# Patient Record
Sex: Male | Born: 1990
Health system: Southern US, Community
[De-identification: ages and names within clinical notes are randomized; demographics above are authoritative.]

---

## 2020-03-16 ENCOUNTER — Ambulatory Visit
Admission: EM | Admit: 2020-03-16 | Discharge: 2020-03-16 | Disposition: A | Discharge status: Home / Self Care | Payer: BC Managed Care – PPO | Attending: Emergency Medicine | Admitting: Emergency Medicine

## 2020-03-16 ENCOUNTER — Encounter: Payer: Self-pay | Admitting: Emergency Medicine

## 2020-03-16 ENCOUNTER — Ambulatory Visit (INDEPENDENT_AMBULATORY_CARE_PROVIDER_SITE_OTHER): Payer: BC Managed Care – PPO

## 2020-03-16 ENCOUNTER — Other Ambulatory Visit: Payer: Self-pay

## 2020-03-16 DIAGNOSIS — W208XXA Other cause of strike by thrown, projected or falling object, initial encounter: Secondary | ICD-10-CM

## 2020-03-16 DIAGNOSIS — S20222A Contusion of left back wall of thorax, initial encounter: Secondary | ICD-10-CM

## 2020-03-16 DIAGNOSIS — M545 Low back pain: Secondary | ICD-10-CM | POA: Diagnosis not present

## 2020-03-16 DIAGNOSIS — S30810A Abrasion of lower back and pelvis, initial encounter: Secondary | ICD-10-CM | POA: Diagnosis not present

## 2020-03-16 MED ORDER — CYCLOBENZAPRINE HCL 5 MG PO TABS
5.0000 mg | ORAL_TABLET | Freq: Every evening | ORAL | 0 refills | Status: DC | PRN
Start: 2020-03-16 — End: 2020-08-08

## 2020-03-16 MED ORDER — IBUPROFEN 800 MG PO TABS
800.0000 mg | ORAL_TABLET | Freq: Three times a day (TID) | ORAL | 0 refills | Status: DC
Start: 2020-03-16 — End: 2020-08-08

## 2020-03-16 NOTE — Discharge Instructions (Addendum)
Recommend RICE: rest, ice, compression, elevation as needed for pain.    Heat therapy (hot compress, warm wash rag, hot showers, etc.) can help relax muscles and soothe muscle aches. Cold therapy (ice packs) can be used to help swelling both after injury and after prolonged use of areas of chronic pain/aches.  For pain: recommend 350 mg-1000 mg of Tylenol (acetaminophen) and/or 200 mg - 800 mg of Advil (ibuprofen, Motrin) every 8 hours as needed.  May alternate between the two throughout the day as they are generally safe to take together.  DO NOT exceed more than 3000 mg of Tylenol or 3200 mg of ibuprofen in a 24 hour period as this could damage your stomach, kidneys, liver, or increase your bleeding risk.  May take muscle relaxer as needed for severe pain / spasm.  (This medication may cause you to become tired so it is important you do not drink alcohol or operate heavy machinery while on this medication.  Recommend your first dose to be taken before bedtime to monitor for side effects safely) 

## 2020-03-16 NOTE — ED Provider Notes (Signed)
[facility]    CSN: [id number] Arrival date & time: 03/16/20  1343      History   Chief Complaint Chief Complaint  Patient presents with  . Back Pain    HPI Jeffrey Preston is a 29 y.o. male presenting for left low back pain status post injury.  States this occurred Thursday: metal trailer door fell onto him.  No head trauma, LOC.  Patient denies abdominal pain, chest pain, difficulty breathing, change in urination or bowel habit.  No hematuria, hematochezia, melena.  Taken OTC meds without relief.  Does endorse chronic low back pain, though this is worse, radiates down leg.  No saddle anesthesia, weakness, urinary retention or fecal incontinence.   History reviewed. No pertinent past medical history.  There are no problems to display for this patient.   History reviewed. No pertinent surgical history.     Home Medications    Prior to Admission medications   Medication Sig Start Date End Date Taking? Authorizing Provider  cyclobenzaprine (FLEXERIL) 5 MG tablet Take 1 tablet (5 mg total) by mouth at bedtime as needed for muscle spasms. 03/16/20   Hall-Potvin, [place], PA-C  ibuprofen (ADVIL) 800 MG tablet Take 1 tablet (800 mg total) by mouth 3 (three) times daily. 03/16/20   Hall-Potvin, [place], PA-C    Family History History reviewed. No pertinent family history.  Social History Social History   Tobacco Use  . Smoking status: Current Every Day Smoker  . Smokeless tobacco: Never Used  Substance Use Topics  . Alcohol use: Yes  . Drug use: Never     Allergies   Patient has no known allergies.   Review of Systems As per HPI   Physical Exam Triage Vital Signs ED Triage Vitals [03/16/20 1358]  Enc Vitals Group     BP (!) 159/83     Pulse Rate 90     Resp 18     Temp 98.6 F (37 C)     Temp Source Oral     SpO2 97 %     Weight      Height      Head Circumference      Peak Flow      Pain Score 7     Pain Loc      Pain Edu?      Excl.  in [place]?    No data found.  Updated Vital Signs BP (!) 159/83 (BP Location: Left Arm)   Pulse 90   Temp 98.6 F (37 C) (Oral)   Resp 18   SpO2 97%   Visual Acuity Right Eye Distance:   Left Eye Distance:   Bilateral Distance:    Right Eye Near:   Left Eye Near:    Bilateral Near:     Physical Exam Constitutional:      General: He is not in acute distress. HENT:     Head: Normocephalic and atraumatic.  Eyes:     General: No scleral icterus.    Pupils: Pupils are equal, round, and reactive to light.  Cardiovascular:     Rate and Rhythm: Normal rate.  Pulmonary:     Effort: Pulmonary effort is normal. No respiratory distress.     Breath sounds: No wheezing.  Musculoskeletal:        General: Swelling and tenderness present.     Comments: Slightly decreased flexion of lumbar spine second to pain.  Patient does have minimal spinous process tenderness without swelling.  Patient does have significant  left PSIS contusion with tenderness.  No crepitus, mass.  Skin:    Coloration: Skin is not jaundiced or pale.     Findings: Bruising present.  Neurological:     Mental Status: He is alert and oriented to person, place, and time.      UC Treatments / Results  Labs (all labs ordered are listed, but only abnormal results are displayed) Labs Reviewed - No data to display  EKG   Radiology DG Lumbar Spine Complete  Result Date: 03/16/2020 CLINICAL DATA:  29 year old male with continued pain 4 days after blunt trauma with large abrasion L5-S1. EXAM: LUMBAR SPINE - COMPLETE 4+ VIEW COMPARISON:  None. FINDINGS: Normal lumbar segmentation and bone mineralization. Straightening of lumbar lordosis. No spondylolisthesis. No pars fracture. Visible sacrum and SI joints appear intact. No acute osseous abnormality identified. Relatively preserved disc spaces. Visible lower ribs appear intact. Negative abdominal visceral contours. IMPRESSION: Negative radiographic appearance of the lumbar  spine. Electronically Signed   By: Odessa Fleming M.D.   On: 03/16/2020 14:31    Procedures Procedures (including critical care time)  Medications Ordered in UC Medications - No data to display  Initial Impression / Assessment and Plan / UC Course  I have reviewed the triage vital signs and the nursing notes.  Pertinent labs & imaging results that were available during my care of the patient were reviewed by me and considered in my medical decision making (see chart for details).     Patient febrile, nontoxic in office today.  Patient does have significant contusion with mechanism of injury.  X-ray of lumbar spine done office, reviewed by me radiology: Negative for disc herniation, fracture.  Visible sacrum and SI joints intact.  Reviewed signs of patient verbalized understanding.  Will treat supportively as outlined below, provide contact information for orthopedic follow-up.  Return precautions discussed, patient verbalized understanding and is agreeable to plan. Final Clinical Impressions(s) / UC Diagnoses   Final diagnoses:  Back contusion, left, initial encounter     Discharge Instructions     Recommend RICE: rest, ice, compression, elevation as needed for pain.    Heat therapy (hot compress, warm wash rag, hot showers, etc.) can help relax muscles and soothe muscle aches. Cold therapy (ice packs) can be used to help swelling both after injury and after prolonged use of areas of chronic pain/aches.  For pain: recommend 350 mg-1000 mg of Tylenol (acetaminophen) and/or 200 mg - 800 mg of Advil (ibuprofen, Motrin) every 8 hours as needed.  May alternate between the two throughout the day as they are generally safe to take together.  DO NOT exceed more than 3000 mg of Tylenol or 3200 mg of ibuprofen in a 24 hour period as this could damage your stomach, kidneys, liver, or increase your bleeding risk.  May take muscle relaxer as needed for severe pain / spasm.  (This medication may cause  you to become tired so it is important you do not drink alcohol or operate heavy machinery while on this medication.  Recommend your first dose to be taken before bedtime to monitor for side effects safely)    ED Prescriptions    Medication Sig Dispense Auth. Provider   ibuprofen (ADVIL) 800 MG tablet Take 1 tablet (800 mg total) by mouth 3 (three) times daily. 21 tablet Hall-Potvin, [place], PA-C   cyclobenzaprine (FLEXERIL) 5 MG tablet Take 1 tablet (5 mg total) by mouth at bedtime as needed for muscle spasms. 15 tablet Hall-Potvin, [place], PA-C  I have reviewed the PDMP during this encounter.   Hall-Potvin, [place], [place] 03/16/20 1447

## 2020-03-16 NOTE — ED Triage Notes (Signed)
Pt presents to [facility] for assessment after having a metal trailer door fall onto his left lower back on Thursday of last week.  States his back pain was improving over the weekend, but he went to try to pick some stuff up today at work and the pain was too intense to continue working.  C/o radiation down left leg.  C/o increased pain with urination and bowel movements.

## 2020-07-28 ENCOUNTER — Ambulatory Visit: Payer: Self-pay

## 2020-07-28 ENCOUNTER — Ambulatory Visit
Admission: EM | Admit: 2020-07-28 | Discharge: 2020-07-28 | Disposition: A | Discharge status: Home / Self Care | Payer: BC Managed Care – PPO

## 2020-07-28 DIAGNOSIS — M5459 Other low back pain: Secondary | ICD-10-CM | POA: Diagnosis not present

## 2020-07-28 DIAGNOSIS — R1032 Left lower quadrant pain: Secondary | ICD-10-CM

## 2020-07-28 DIAGNOSIS — R102 Pelvic and perineal pain: Secondary | ICD-10-CM | POA: Diagnosis not present

## 2020-07-28 DIAGNOSIS — R109 Unspecified abdominal pain: Secondary | ICD-10-CM | POA: Diagnosis not present

## 2020-07-28 NOTE — ED Provider Notes (Signed)
[facility]    CSN: [id number] Arrival date & time: 07/28/20  0810      History   Chief Complaint Chief Complaint  Patient presents with  . Abdominal Pain    HPI Jeffrey Preston is a 29 y.o. male  Presenting for left groin pain for the last few weeks, worsening over the last 2 days.  States it radiates down to his testicle, though he has been finding it very difficult to hold still.  Denies change in urination.  States bowels are looser than normal without blood or melena.  Denies nausea, vomiting, fever.  Penile discharge or pain, lesions.  History reviewed. No pertinent past medical history.  There are no problems to display for this patient.   History reviewed. No pertinent surgical history.     Home Medications    Prior to Admission medications   Medication Sig Start Date End Date Taking? Authorizing Provider  cyclobenzaprine (FLEXERIL) 5 MG tablet Take 1 tablet (5 mg total) by mouth at bedtime as needed for muscle spasms. 03/16/20   Hall-Potvin, [place], PA-C  ibuprofen (ADVIL) 800 MG tablet Take 1 tablet (800 mg total) by mouth 3 (three) times daily. 03/16/20   Hall-Potvin, [place], PA-C    Family History History reviewed. No pertinent family history.  Social History Social History   Tobacco Use  . Smoking status: Current Every Day Smoker  . Smokeless tobacco: Never Used  Substance Use Topics  . Alcohol use: Yes  . Drug use: Never     Allergies   Patient has no known allergies.   Review of Systems Review of Systems  Constitutional: Negative for fatigue and fever.  Gastrointestinal: Positive for abdominal pain. Negative for abdominal distention, blood in stool, nausea and rectal pain.  Genitourinary: Positive for testicular pain. Negative for discharge, dysuria, frequency, genital sores, penile pain, penile swelling, scrotal swelling and urgency.  Musculoskeletal: Negative for arthralgias and myalgias.  Skin: Negative for color change and  rash.     Physical Exam Triage Vital Signs ED Triage Vitals  Enc Vitals Group     BP      Pulse      Resp      Temp      Temp src      SpO2      Weight      Height      Head Circumference      Peak Flow      Pain Score      Pain Loc      Pain Edu?      Excl. in [place]?    No data found.  Updated Vital Signs BP (!) 142/90 (BP Location: Left Arm)   Pulse 80   Temp 98.3 F (36.8 C) (Oral)   Resp 18   SpO2 97%   Visual Acuity Right Eye Distance:   Left Eye Distance:   Bilateral Distance:    Right Eye Near:   Left Eye Near:    Bilateral Near:     Physical Exam Constitutional:      General: He is not in acute distress.    Appearance: He is ill-appearing.     Comments: Patient appears very uncomfortable, has hard time finding position of comfort  HENT:     Head: Normocephalic and atraumatic.  Eyes:     General: No scleral icterus.    Pupils: Pupils are equal, round, and reactive to light.  Cardiovascular:     Rate and Rhythm: Normal rate.  Pulmonary:     Effort: Pulmonary effort is normal. No respiratory distress.     Breath sounds: No wheezing.  Abdominal:     General: Abdomen is flat. Bowel sounds are normal. There is no distension or abdominal bruit.     Palpations: Abdomen is soft.     Tenderness: There is abdominal tenderness.  Genitourinary:    Penis: Circumcised.      Testes:        Right: Mass, tenderness, swelling, testicular hydrocele or varicocele not present. Cremasteric reflex is present.         Left: Tenderness present. Swelling, testicular hydrocele or varicocele not present. Cremasteric reflex is present.      Tanner stage (genital): 5.       Comments: Pt does have perineum tenderness Skin:    Coloration: Skin is not jaundiced or pale.  Neurological:     Mental Status: He is alert and oriented to person, place, and time.      UC Treatments / Results  Labs (all labs ordered are listed, but only abnormal results are displayed) Labs  Reviewed - No data to display  EKG   Radiology No results found.  Procedures Procedures (including critical care time)  Medications Ordered in UC Medications - No data to display  Initial Impression / Assessment and Plan / UC Course  I have reviewed the triage vital signs and the nursing notes.  Pertinent labs & imaging results that were available during my care of the patient were reviewed by me and considered in my medical decision making (see chart for details).     Patient afebrile, nontoxic in office today.  Does appear to be in significant discomfort.  Difficult to decipher if pain is radiating to left testicle, or if he is having testicular pain.  Patient is diffusely tender.  Patient concern for hernia: Discussed this is likely.  Discussed possibility strangulation.  Given significant pain, recommend he go to ER for further evaluation.  Return precautions discussed, pt verbalized understanding and is agreeable to plan. Final Clinical Impressions(s) / UC Diagnoses   Final diagnoses:  Left groin pain   Discharge Instructions   None    ED Prescriptions    None     PDMP not reviewed this encounter.   Hall-Potvin, [place], [place] 07/28/20 [phone]

## 2020-07-28 NOTE — ED Triage Notes (Signed)
Pt c/o LLQ pain radiating to lt testicle x2 days. States had a trailer ramp hit him lt/mid back/side area over a month ago.

## 2020-08-08 ENCOUNTER — Encounter: Payer: Self-pay | Admitting: Family Medicine

## 2020-08-08 ENCOUNTER — Ambulatory Visit
Admission: EM | Admit: 2020-08-08 | Discharge: 2020-08-08 | Disposition: A | Discharge status: Home / Self Care | Payer: BC Managed Care – PPO | Attending: Family Medicine | Admitting: Family Medicine

## 2020-08-08 ENCOUNTER — Other Ambulatory Visit: Payer: Self-pay

## 2020-08-08 ENCOUNTER — Ambulatory Visit: Payer: Self-pay

## 2020-08-08 DIAGNOSIS — J069 Acute upper respiratory infection, unspecified: Secondary | ICD-10-CM | POA: Diagnosis not present

## 2020-08-08 NOTE — ED Provider Notes (Signed)
[facility]    CSN: [id number] Arrival date & time: 08/08/20  0802      History   Chief Complaint Chief Complaint  Patient presents with  . Cough    HPI Jeffrey Preston is a 29 y.o. male.   Established [facility] patient presents with chief complaint of cough, onset yesterday.  He was sent here by his employer who has had Covid himself.    Patient is a smoker and has three children at home.  He often gets a seasonal cough.  No shortness of breath, GI sx, loss of smell or taste, sore throat.     History reviewed. No pertinent past medical history.  There are no problems to display for this patient.   History reviewed. No pertinent surgical history.     Home Medications    Prior to Admission medications   Medication Sig Start Date End Date Taking? Authorizing Provider  cyclobenzaprine (FLEXERIL) 10 MG tablet Take by mouth.    [provider]  ibuprofen (ADVIL) 800 MG tablet Take by mouth.    [provider]    Family History Family History  Family history unknown: Yes    Social History Social History   Tobacco Use  . Smoking status: Current Every Day Smoker    Types: Cigarettes  . Smokeless tobacco: Never Used  Substance Use Topics  . Alcohol use: Yes  . Drug use: Never     Allergies   Patient has no known allergies.   Review of Systems Review of Systems  Constitutional: Negative.   HENT: Positive for congestion.   Respiratory: Positive for cough.      Physical Exam Triage Vital Signs ED Triage Vitals  Enc Vitals Group     BP      Pulse      Resp      Temp      Temp src      SpO2      Weight      Height      Head Circumference      Peak Flow      Pain Score      Pain Loc      Pain Edu?      Excl. in [place]?    No data found.  Updated Vital Signs BP (!) 141/98 (BP Location: Left Arm)   Pulse 97   Temp 97.9 F (36.6 C) (Oral)   Resp 20   SpO2 97%     Physical Exam Vitals and nursing note reviewed.   Constitutional:      General: He is not in acute distress.    Appearance: Normal appearance. He is normal weight.  HENT:     Mouth/Throat:     Mouth: Mucous membranes are moist.  Eyes:     Conjunctiva/sclera: Conjunctivae normal.  Cardiovascular:     Rate and Rhythm: Normal rate.  Pulmonary:     Effort: Pulmonary effort is normal.     Breath sounds: Normal breath sounds.  Musculoskeletal:        General: Normal range of motion.     Cervical back: Normal range of motion and neck supple.  Skin:    General: Skin is warm and dry.  Neurological:     General: No focal deficit present.     Mental Status: He is alert and oriented to person, place, and time.  Psychiatric:        Mood and Affect: Mood normal.  Behavior: Behavior normal.        Thought Content: Thought content normal.      UC Treatments / Results  Labs (all labs ordered are listed, but only abnormal results are displayed) Labs Reviewed - No data to display  EKG   Radiology No results found.  Procedures Procedures (including critical care time)  Medications Ordered in UC Medications - No data to display  Initial Impression / Assessment and Plan / UC Course  I have reviewed the triage vital signs and the nursing notes.  Pertinent labs & imaging results that were available during my care of the patient were reviewed by me and considered in my medical decision making (see chart for details).    Final Clinical Impressions(s) / UC Diagnoses   Final diagnoses:  Acute upper respiratory infection     Discharge Instructions     Strategies to prevent and/or treat COVID-19:  Vitamin D3 5000 IU (125 mcg) daily Vitamin C 500 mg twice daily Zinc 50 to 75 mg daily      ED Prescriptions    None     I have reviewed the PDMP during this encounter.   Elvina Sidle, MD 08/08/20 [phone]

## 2020-08-08 NOTE — ED Triage Notes (Signed)
Pt is here with a cough that started yesterday, pt has not taken any meds to relieve discomfort.  

## 2020-08-08 NOTE — Discharge Instructions (Addendum)
Strategies to prevent and/or treat COVID-19:  Vitamin D3 5000 IU (125 mcg) daily Vitamin C 500 mg twice daily Zinc 50 to 75 mg daily

## 2020-08-09 LAB — SARS-COV-2, NAA 2 DAY TAT

## 2020-08-09 LAB — NOVEL CORONAVIRUS, NAA: SARS-CoV-2, NAA: NOT DETECTED

## 2020-09-02 DIAGNOSIS — Z20822 Contact with and (suspected) exposure to covid-19: Secondary | ICD-10-CM | POA: Diagnosis not present

## 2020-11-10 DIAGNOSIS — Z03818 Encounter for observation for suspected exposure to other biological agents ruled out: Secondary | ICD-10-CM | POA: Diagnosis not present

## 2020-11-10 DIAGNOSIS — Z20822 Contact with and (suspected) exposure to covid-19: Secondary | ICD-10-CM | POA: Diagnosis not present

## 2020-11-13 DIAGNOSIS — Z03818 Encounter for observation for suspected exposure to other biological agents ruled out: Secondary | ICD-10-CM | POA: Diagnosis not present

## 2020-11-13 DIAGNOSIS — Z20822 Contact with and (suspected) exposure to covid-19: Secondary | ICD-10-CM | POA: Diagnosis not present

## 2020-11-27 ENCOUNTER — Ambulatory Visit: Payer: Self-pay

## 2020-11-27 DIAGNOSIS — L03039 Cellulitis of unspecified toe: Secondary | ICD-10-CM | POA: Diagnosis not present

## 2020-12-03 ENCOUNTER — Other Ambulatory Visit: Payer: Self-pay

## 2020-12-03 ENCOUNTER — Ambulatory Visit: Payer: BC Managed Care – PPO | Admitting: Podiatry

## 2020-12-03 DIAGNOSIS — L600 Ingrowing nail: Secondary | ICD-10-CM | POA: Diagnosis not present

## 2020-12-03 DIAGNOSIS — L601 Onycholysis: Secondary | ICD-10-CM

## 2020-12-03 MED ORDER — NEOMYCIN-POLYMYXIN-HC 3.5-10000-1 OT SUSP: OTIC | 0 refills | Status: AC

## 2020-12-03 MED ORDER — NEOMYCIN-POLYMYXIN-HC 3.5-10000-1 OT SUSP: OTIC | 0 refills | Status: DC

## 2020-12-03 NOTE — Patient Instructions (Signed)

## 2020-12-07 NOTE — Progress Notes (Signed)
  Subjective:  Patient ID: Jeffrey Preston, male    DOB: 09-Aug-1991,  MRN: [id number]  Chief Complaint  Patient presents with  . Nail Problem    Right hallux nail is trying to separate from the skin at the top of the toe. PT stated that it is painful     30 y.o. male presents with the above complaint. History confirmed with patient.  Thinks he damaged it many years ago  Objective:  Physical Exam: warm, good capillary refill, no trophic changes or ulcerative lesions, normal DP and PT pulses and normal sensory exam.   Right Foot: Hallux nail medial and lateral borders ingrowing, there is onycholysis of the distal half of the nail plate  Assessment:   1. Ingrowing right great toenail   2. Onycholysis      Plan:  Patient was evaluated and treated and all questions answered.     Ingrown Nail, right -Patient elects to proceed with minor surgery to remove ingrown toenail today. Consent reviewed and signed by patient. -Ingrown nail excised. See procedure note. -Educated on post-procedure care including soaking. Written instructions provided and reviewed. -Patient to follow up in 2 weeks for nail check.  Procedure: Excision of Ingrown Toenail Location: Right 1st toe medial and lateral nail borders. Anesthesia: Lidocaine 1% plain; 1.5 mL and Marcaine 0.5% plain; 1.5 mL, digital block. Skin Prep: Betadine. Dressing: Silvadene; telfa; dry, sterile, compression dressing. Technique: Following skin prep, the toe was exsanguinated and a tourniquet was secured at the base of the toe. The affected nail border was freed, split with a nail splitter, and excised. Chemical matrixectomy was then performed with phenol and irrigated out with alcohol. The tourniquet was then removed and sterile dressing applied. Disposition: Patient tolerated procedure well. Patient to return in 2 weeks for follow-up.     Return in about 2 weeks (around 12/17/2020) for nail re-check.

## 2020-12-17 ENCOUNTER — Ambulatory Visit: Payer: BC Managed Care – PPO | Admitting: Podiatry

## 2020-12-21 ENCOUNTER — Ambulatory Visit: Payer: BC Managed Care – PPO | Admitting: Podiatry

## 2021-01-22 ENCOUNTER — Ambulatory Visit: Payer: Self-pay

## 2021-01-22 ENCOUNTER — Ambulatory Visit
Admission: EM | Admit: 2021-01-22 | Discharge: 2021-01-22 | Disposition: A | Discharge status: Home / Self Care | Payer: BC Managed Care – PPO | Attending: Internal Medicine | Admitting: Internal Medicine

## 2021-01-22 DIAGNOSIS — K2970 Gastritis, unspecified, without bleeding: Secondary | ICD-10-CM | POA: Diagnosis not present

## 2021-01-22 MED ORDER — ONDANSETRON 4 MG PO TBDP
4.0000 mg | ORAL_TABLET | Freq: Three times a day (TID) | ORAL | 0 refills | Status: AC | PRN
Start: 2021-01-22 — End: ?

## 2021-01-22 NOTE — ED Triage Notes (Addendum)
Pt present abdominal pain, symptoms started on four days ago. Pt states he vomited yesterday. Pt states the abdominal pain the will go away but there returns. Pt took an at home covid test and the results where negative

## 2021-01-22 NOTE — Discharge Instructions (Signed)
Increase oral fluid intake Take medications as prescribed If symptoms worsen please return to the urgent care Home COVID test was negative for COVID-19 infection hence no need for retesting

## 2021-01-22 NOTE — ED Provider Notes (Signed)
[facility]    CSN: [id number] Arrival date & time: 01/22/21  1320      History   Chief Complaint Chief Complaint  Patient presents with  . Abdominal Pain    HPI Jeffrey Preston is a 30 y.o. male comes to the urgent care with 4 day history of abdominal pain, generalized malaise.   Onset was insidious and has been progressively worse.  Patient has some nausea and one episode of nonbloody nonbilious vomiting.  No dizziness, near syncope or syncopal episode.  No sick contacts.  Home COVID test is negative.  Patient is not vaccinated against COVID-19 virus.  No generalized body ache  HPI  History reviewed. No pertinent past medical history.  There are no problems to display for this patient.   History reviewed. No pertinent surgical history.     Home Medications    Prior to Admission medications   Medication Sig Start Date End Date Taking? Authorizing Provider  ondansetron (ZOFRAN ODT) 4 MG disintegrating tablet Take 1 tablet (4 mg total) by mouth every 8 (eight) hours as needed for nausea or vomiting. 01/22/21  Yes Amalea Ottey, Britta Mccreedy, MD  cephALEXin (KEFLEX) 500 MG capsule Take 500 mg by mouth 3 (three) times daily. 11/27/20   [provider]  cyclobenzaprine (FLEXERIL) 10 MG tablet Take by mouth.    [provider]  ibuprofen (ADVIL) 800 MG tablet Take by mouth.    [provider]  neomycin-polymyxin-hydrocortisone (CORTISPORIN) 3.5-10000-1 OTIC suspension Apply 1-2 drops to right great toe daily after soaking and cover with bandaid 12/03/20   McDonald, Rachelle Hora, DPM    Family History Family History  Family history unknown: Yes    Social History Social History   Tobacco Use  . Smoking status: Current Every Day Smoker    Types: Cigarettes  . Smokeless tobacco: Never Used  Substance Use Topics  . Alcohol use: Yes  . Drug use: Never     Allergies   Patient has no known allergies.   Review of Systems Review of Systems   Constitutional: Negative.   Gastrointestinal: Positive for abdominal pain, nausea and vomiting.  Genitourinary: Negative.   Neurological: Negative.      Physical Exam Triage Vital Signs ED Triage Vitals  Enc Vitals Group     BP 01/22/21 1454 114/75     Pulse Rate 01/22/21 1454 60     Resp 01/22/21 1454 20     Temp 01/22/21 1454 97.6 F (36.4 C)     Temp Source 01/22/21 1454 Oral     SpO2 01/22/21 1454 97 %     Weight --      Height --      Head Circumference --      Peak Flow --      Pain Score 01/22/21 1500 6     Pain Loc --      Pain Edu? --      Excl. in [place]? --    No data found.  Updated Vital Signs BP 114/75 (BP Location: Left Arm)   Pulse 60   Temp 97.6 F (36.4 C) (Oral)   Resp 20   SpO2 97%   Visual Acuity Right Eye Distance:   Left Eye Distance:   Bilateral Distance:    Right Eye Near:   Left Eye Near:    Bilateral Near:     Physical Exam Vitals and nursing note reviewed.  Constitutional:      General: He is not in acute distress.  Appearance: He is not ill-appearing.  Cardiovascular:     Rate and Rhythm: Normal rate and regular rhythm.  Pulmonary:     Effort: Pulmonary effort is normal.     Breath sounds: Normal breath sounds.  Abdominal:     Palpations: Abdomen is soft. There is no shifting dullness or splenomegaly.     Tenderness: There is no abdominal tenderness.     Hernia: No hernia is present.  Neurological:     Mental Status: He is alert.      UC Treatments / Results  Labs (all labs ordered are listed, but only abnormal results are displayed) Labs Reviewed - No data to display  EKG   Radiology No results found.  Procedures Procedures (including critical care time)  Medications Ordered in UC Medications - No data to display  Initial Impression / Assessment and Plan / UC Course  I have reviewed the triage vital signs and the nursing notes.  Pertinent labs & imaging results that were available during my care of the  patient were reviewed by me and considered in my medical decision making (see chart for details).     1.  Viral gastritis: Zofran as needed for nausea/vomiting Increase oral fluid intake If symptoms worsen please return to the urgent care to be reevaluated. Final Clinical Impressions(s) / UC Diagnoses   Final diagnoses:  Viral gastritis     Discharge Instructions     Increase oral fluid intake Take medications as prescribed If symptoms worsen please return to the urgent care Home COVID test was negative for COVID-19 infection hence no need for retesting   ED Prescriptions    Medication Sig Dispense Auth. Provider   ondansetron (ZOFRAN ODT) 4 MG disintegrating tablet Take 1 tablet (4 mg total) by mouth every 8 (eight) hours as needed for nausea or vomiting. 20 tablet Zohra Clavel, Britta Mccreedy, MD     PDMP not reviewed this encounter.   Merrilee Jansky, MD 01/22/21 [phone]

## 2021-08-25 ENCOUNTER — Ambulatory Visit (INDEPENDENT_AMBULATORY_CARE_PROVIDER_SITE_OTHER): Payer: BC Managed Care – PPO

## 2021-08-25 ENCOUNTER — Ambulatory Visit
Admission: EM | Admit: 2021-08-25 | Discharge: 2021-08-25 | Disposition: A | Discharge status: Home / Self Care | Payer: BC Managed Care – PPO | Attending: Internal Medicine | Admitting: Internal Medicine

## 2021-08-25 DIAGNOSIS — M542 Cervicalgia: Secondary | ICD-10-CM | POA: Diagnosis not present

## 2021-08-25 DIAGNOSIS — M25511 Pain in right shoulder: Secondary | ICD-10-CM

## 2021-08-25 DIAGNOSIS — R0782 Intercostal pain: Secondary | ICD-10-CM | POA: Diagnosis not present

## 2021-08-25 DIAGNOSIS — R0781 Pleurodynia: Secondary | ICD-10-CM | POA: Diagnosis not present

## 2021-08-25 MED ORDER — IBUPROFEN 800 MG PO TABS: 800.0000 mg | ORAL_TABLET | Freq: Three times a day (TID) | ORAL | 0 refills | Status: AC | PRN

## 2021-08-25 NOTE — ED Triage Notes (Signed)
Patient states his right shoulder and underarm are in horrible pain. He states it started about mid day yesterday.   He works Holiday representative but Biochemist, clinical anything.   He took gas x yesterday and no relief.   Patient states the pain is worse today.

## 2021-08-25 NOTE — Discharge Instructions (Signed)
You were prescribed ibuprofen to take as needed for pain and inflammation.  Please alternate ice and heat application to affected area of pain as well.  Follow-up with provided confirmation for orthopedist for further evaluation and management.

## 2021-08-25 NOTE — ED Provider Notes (Signed)
[facility]    CSN: [id number] Arrival date & time: 08/25/21  1647      History   Chief Complaint No chief complaint on file.   HPI Jeffrey Preston is a 30 y.o. male.   Patient presents with right shoulder and underarm pain that started yesterday at about 12 PM.  Patient denies any apparent acute injury or chronic injury.  Patient reports that the pain starts in the shoulder and wraps around arm into the right underarm/chest area.  Pain also radiates up to the right neck.  Denies any numbness or tingling.  Patient does work in [employer] but denies any apparent injury or lifting anything heavy recently.  Pain is worsened today.  Has not yet taken any medications to help alleviate symptoms.  Denies any upper respiratory symptoms or fever.    History reviewed. No pertinent past medical history.  There are no problems to display for this patient.   History reviewed. No pertinent surgical history.     Home Medications    Prior to Admission medications   Medication Sig Start Date End Date Taking? Authorizing Provider  ibuprofen (ADVIL) 800 MG tablet Take 1 tablet (800 mg total) by mouth 3 (three) times daily as needed for mild pain or moderate pain. 08/25/21  Yes Bryahna Lesko, Hildred Alamin E, FNP  cephALEXin (KEFLEX) 500 MG capsule Take 500 mg by mouth 3 (three) times daily. 11/27/20   [provider]  cyclobenzaprine (FLEXERIL) 10 MG tablet Take by mouth.    [provider]  neomycin-polymyxin-hydrocortisone (CORTISPORIN) 3.5-10000-1 OTIC suspension Apply 1-2 drops to right great toe daily after soaking and cover with bandaid 12/03/20   McDonald, Adam R, DPM  ondansetron (ZOFRAN ODT) 4 MG disintegrating tablet Take 1 tablet (4 mg total) by mouth every 8 (eight) hours as needed for nausea or vomiting. 01/22/21   Lamptey, Myrene Galas, MD    Family History Family History  Family history unknown: Yes    Social History Social History   Tobacco Use   Smoking status:  Every Day    Types: Cigarettes   Smokeless tobacco: Never  Vaping Use   Vaping Use: Never used  Substance Use Topics   Alcohol use: Yes    Comment: Occasionally   Drug use: Never     Allergies   Patient has no known allergies.   Review of Systems Review of Systems Per HPI  Physical Exam Triage Vital Signs ED Triage Vitals  Enc Vitals Group     BP 08/25/21 1703 129/88     Pulse Rate 08/25/21 1703 87     Resp 08/25/21 1703 18     Temp 08/25/21 1703 98.4 F (36.9 C)     Temp Source 08/25/21 1703 Oral     SpO2 08/25/21 1703 95 %     Weight --      Height --      Head Circumference --      Peak Flow --      Pain Score 08/25/21 1700 8     Pain Loc --      Pain Edu? --      Excl. in [place]? --    No data found.  Updated Vital Signs BP 129/88 (BP Location: Right Arm)   Pulse 87   Temp 98.4 F (36.9 C) (Oral)   Resp 18   SpO2 95%   Visual Acuity Right Eye Distance:   Left Eye Distance:   Bilateral Distance:    Right Eye Near:  Left Eye Near:    Bilateral Near:     Physical Exam Constitutional:      General: He is not in acute distress.    Appearance: Normal appearance. He is not toxic-appearing or diaphoretic.  HENT:     Head: Normocephalic and atraumatic.  Eyes:     Extraocular Movements: Extraocular movements intact.     Conjunctiva/sclera: Conjunctivae normal.  Cardiovascular:     Rate and Rhythm: Normal rate and regular rhythm.     Pulses: Normal pulses.     Heart sounds: Normal heart sounds.  Pulmonary:     Effort: Pulmonary effort is normal. No respiratory distress.     Breath sounds: Normal breath sounds.  Chest:     Chest wall: Tenderness present.     Comments: Patient has tenderness to palpation to right upper rib cage. Musculoskeletal:     Right shoulder: Tenderness and bony tenderness present. No swelling or crepitus. Normal range of motion. Normal strength. Normal pulse.     Comments: Patient has generalized pain throughout entire right  shoulder.  Neurovascular intact.  Grip strength 5/5.  Neurological:     General: No focal deficit present.     Mental Status: He is alert and oriented to person, place, and time. Mental status is at baseline.  Psychiatric:        Mood and Affect: Mood normal.        Behavior: Behavior normal.        Thought Content: Thought content normal.        Judgment: Judgment normal.     UC Treatments / Results  Labs (all labs ordered are listed, but only abnormal results are displayed) Labs Reviewed - No data to display  EKG   Radiology DG Ribs Unilateral W/Chest Right  Result Date: 08/25/2021 CLINICAL DATA:  Right-sided rib pain EXAM: RIGHT RIBS AND CHEST - 3+ VIEW COMPARISON:  None. FINDINGS: No fracture or other bone lesions are seen involving the ribs. There is no evidence of pneumothorax or pleural effusion. Both lungs are clear. Heart size and mediastinal contours are within normal limits. Probable calcified granuloma in the left upper lung. IMPRESSION: Negative. Electronically Signed   By: Jasmine Pang M.D.   On: 08/25/2021 18:01   DG Shoulder Right  Result Date: 08/25/2021 CLINICAL DATA:  Shoulder pain EXAM: RIGHT SHOULDER - 2+ VIEW COMPARISON:  None. FINDINGS: There is no evidence of fracture or dislocation. There is no evidence of arthropathy or other focal bone abnormality. Soft tissues are unremarkable. IMPRESSION: Negative. Electronically Signed   By: Jasmine Pang M.D.   On: 08/25/2021 18:00    Procedures Procedures (including critical care time)  Medications Ordered in UC Medications - No data to display  Initial Impression / Assessment and Plan / UC Course  I have reviewed the triage vital signs and the nursing notes.  Pertinent labs & imaging results that were available during my care of the patient were reviewed by me and considered in my medical decision making (see chart for details).     X-rays were fairly unremarkable for any acute bony abnormality.  Suspect  skeletal muscular problem given that pain is reproducible with palpation.  Will prescribe ibuprofen to take for pain and inflammation.  Patient offered ketorolac injection in urgent care but declined.  Patient to alternate ice and heat application as well.  No obvious abnormality or injury on exam.  Patient will need to follow-up with orthopedist for further evaluation and management.  Do not  suspect any underlying organ damage or heart problem given the area of location of pain.  There was a calcified granuloma noted in left lung x-ray.  Advised patient to follow-up with PCP for further evaluation and management.  PCP assistance requested for patient.  Discussed return precautions.  Patient verbalized understanding and was agreeable with plan. Final Clinical Impressions(s) / UC Diagnoses   Final diagnoses:  Acute pain of right shoulder  Rib pain on right side  Neck pain on right side     Discharge Instructions      You were prescribed ibuprofen to take as needed for pain and inflammation.  Please alternate ice and heat application to affected area of pain as well.  Follow-up with provided confirmation for orthopedist for further evaluation and management.     ED Prescriptions     Medication Sig Dispense Auth. Provider   ibuprofen (ADVIL) 800 MG tablet Take 1 tablet (800 mg total) by mouth 3 (three) times daily as needed for mild pain or moderate pain. 21 tablet Warren, Michele Rockers, [place]      PDMP not reviewed this encounter.   Teodora Medici, [place] 08/25/21 [phone]

## 2021-08-30 ENCOUNTER — Encounter: Payer: Self-pay | Admitting: Nurse Practitioner

## 2021-08-30 ENCOUNTER — Telehealth (INDEPENDENT_AMBULATORY_CARE_PROVIDER_SITE_OTHER): Payer: BC Managed Care – PPO | Admitting: Nurse Practitioner

## 2021-08-30 ENCOUNTER — Other Ambulatory Visit: Payer: Self-pay

## 2021-08-30 DIAGNOSIS — F17200 Nicotine dependence, unspecified, uncomplicated: Secondary | ICD-10-CM

## 2021-08-30 DIAGNOSIS — F17210 Nicotine dependence, cigarettes, uncomplicated: Secondary | ICD-10-CM

## 2021-08-30 DIAGNOSIS — R9389 Abnormal findings on diagnostic imaging of other specified body structures: Secondary | ICD-10-CM | POA: Insufficient documentation

## 2021-08-30 NOTE — Progress Notes (Signed)
Virtual Visit via Telephone Note  I connected with Jeffrey Preston on 08/30/21 at  8:20 AM EST by telephone and verified that I am speaking with the correct person using two identifiers.  Location: Patient: home Provider: office   I discussed the limitations, risks, security and privacy concerns of performing an evaluation and management service by telephone and the availability of in person appointments. I also discussed with the patient that there may be a patient responsible charge related to this service. The patient expressed understanding and agreed to proceed.   History of Present Illness:  Patient presents today to establish care.  He states that he has never had a primary care physician.  He has no significant health history and is not currently on any prescription medications.  He did recently have a visit in the ED on 08/25/2021 for right-sided rib pain after acute illness with cough.  Patient is concerned because chest x-ray did show a probable calcified granuloma in the left upper lung.  Patient is a current everyday smoker and was concerned about this.  We discussed that he can return in about a month for repeat chest x-ray for follow-up on this issue.  Patient is currently trying to quit smoking and has cut back to 1 pack per every 3 days. Denies f/c/s, n/v/d, hemoptysis, PND, chest pain or edema.     Observations/Objective:  Vitals with BMI 08/25/2021 01/22/2021 08/08/2020  Systolic 129 114 568  Diastolic 88 75 98  Pulse 87 60 97      Assessment and Plan:  Patient Instructions  Abnormal Chest xray Smoking cessation:  Will repeat chest xray in 1 month  Continue to cut back on smoking and hopefully quit soon  Follow up:  Follow up in 1 month with Amy or Dr. Andrey Campanile  Managing the Challenge of Quitting Smoking Quitting smoking is a physical and mental challenge. You will face cravings, withdrawal symptoms, and temptation. Before quitting, work with your health care  provider to make a plan that can help you manage quitting. Preparation can help you quit and keep you from giving in. How to manage lifestyle changes Managing stress Stress can make you want to smoke, and wanting to smoke may cause stress. It is important to find ways to manage your stress. You might try some of the following: Practice relaxation techniques. Breathe slowly and deeply, in through your nose and out through your mouth. Listen to music. Soak in a bath or take a shower. Imagine a peaceful place or vacation. Get some support. Talk with family or friends about your stress. Join a support group. Talk with a counselor or therapist. Get some physical activity. Go for a walk, run, or bike ride. Play a favorite sport. Practice yoga.  Medicines Talk with your health care provider about medicines that might help you deal with cravings and make quitting easier for you. Relationships Social situations can be difficult when you are quitting smoking. To manage this, you can: Avoid parties and other social situations where people might be smoking. Avoid alcohol. Leave right away if you have the urge to smoke. Explain to your family and friends that you are quitting smoking. Ask for support and let them know you might be a bit grumpy. Plan activities where smoking is not an option. General instructions Be aware that many people gain weight after they quit smoking. However, not everyone does. To keep from gaining weight, have a plan in place before you quit and stick to the plan  after you quit. Your plan should include: Having healthy snacks. When you have a craving, it may help to: Eat popcorn, carrots, celery, or other cut vegetables. Chew sugar-free gum. Changing how you eat. Eat small portion sizes at meals. Eat 4-6 small meals throughout the day instead of 1-2 large meals a day. Be mindful when you eat. Do not watch television or do other things that might distract you as you  eat. Exercising regularly. Make time to exercise each day. If you do not have time for a long workout, do short bouts of exercise for 5-10 minutes several times a day. Do some form of strengthening exercise, such as weight lifting. Do some exercise that gets your heart beating and causes you to breathe deeply, such as walking fast, running, swimming, or biking. This is very important. Drinking plenty of water or other low-calorie or no-calorie drinks. Drink 6-8 glasses of water daily.  How to recognize withdrawal symptoms Your body and mind may experience discomfort as you try to get used to not having nicotine in your system. These effects are called withdrawal symptoms. They may include: Feeling hungrier than normal. Having trouble concentrating. Feeling irritable or restless. Having trouble sleeping. Feeling depressed. Craving a cigarette. To manage withdrawal symptoms: Avoid places, people, and activities that trigger your cravings. Remember why you want to quit. Get plenty of sleep. Avoid coffee and other caffeinated drinks. These may worsen some of your symptoms. These symptoms may surprise you. But be assured that they are normal to have when quitting smoking. How to manage cravings Come up with a plan for how to deal with your cravings. The plan should include the following: A definition of the specific situation you want to deal with. An alternative action you will take. A clear idea for how this action will help. The name of someone who might help you with this. Cravings usually last for 5-10 minutes. Consider taking the following actions to help you with your plan to deal with cravings: Keep your mouth busy. Chew sugar-free gum. Suck on hard candies or a straw. Brush your teeth. Keep your hands and body busy. Change to a different activity right away. Squeeze or play with a ball. Do an activity or a hobby, such as making bead jewelry, practicing needlepoint, or working  with wood. Mix up your normal routine. Take a short exercise break. Go for a quick walk or run up and down stairs. Focus on doing something kind or helpful for someone else. Call a friend or family member to talk during a craving. Join a support group. Contact a quitline. Where to find support To get help or find a support group: Call the National Cancer Institute's Smoking Quitline: 1-800-QUIT NOW (262)543-3603) Visit the website of the Substance Abuse and Mental Health Services Administration: SkateOasis.com.pt Text QUIT to SmokefreeTXT: 063016 Where to find more information Visit these websites to find more information on quitting smoking: National Cancer Institute: www.smokefree.gov American Lung Association: www.lung.org American Cancer Society: www.cancer.org Centers for Disease Control and Prevention: FootballExhibition.com.br American Heart Association: www.heart.org Contact a health care provider if: You want to change your plan for quitting. The medicines you are taking are not helping. Your eating feels out of control or you cannot sleep. Get help right away if: You feel depressed or become very anxious. Summary Quitting smoking is a physical and mental challenge. You will face cravings, withdrawal symptoms, and temptation to smoke again. Preparation can help you as you go through these challenges. Try different  techniques to manage stress, handle social situations, and prevent weight gain. You can deal with cravings by keeping your mouth busy (such as by chewing gum), keeping your hands and body busy, calling family or friends, or contacting a quitline for people who want to quit smoking. You can deal with withdrawal symptoms by avoiding places where people smoke, getting plenty of rest, and avoiding drinks with caffeine. This information is not intended to replace advice given to you by your health care provider. Make sure you discuss any questions you have with your health care  provider. Document Revised: 05/14/2021 Document Reviewed: 06/25/2019 Elsevier Patient Education  2022 [employer].       I discussed the assessment and treatment plan with the patient. The patient was provided an opportunity to ask questions and all were answered. The patient agreed with the plan and demonstrated an understanding of the instructions.   The patient was advised to call back or seek an in-person evaluation if the symptoms worsen or if the condition fails to improve as anticipated.  I provided 23 minutes of non-face-to-face time during this encounter.   Ivonne Andrew, NP

## 2021-08-30 NOTE — Patient Instructions (Addendum)
Abnormal Chest xray Smoking cessation:  Will repeat chest xray in 1 month  Continue to cut back on smoking and hopefully quit soon  Follow up:  Follow up in 1 month with Amy or Dr. Andrey Campanile  Managing the Challenge of Quitting Smoking Quitting smoking is a physical and mental challenge. You will face cravings, withdrawal symptoms, and temptation. Before quitting, work with your health care provider to make a plan that can help you manage quitting. Preparation can help you quit and keep you from giving in. How to manage lifestyle changes Managing stress Stress can make you want to smoke, and wanting to smoke may cause stress. It is important to find ways to manage your stress. You might try some of the following: Practice relaxation techniques. Breathe slowly and deeply, in through your nose and out through your mouth. Listen to music. Soak in a bath or take a shower. Imagine a peaceful place or vacation. Get some support. Talk with family or friends about your stress. Join a support group. Talk with a counselor or therapist. Get some physical activity. Go for a walk, run, or bike ride. Play a favorite sport. Practice yoga.  Medicines Talk with your health care provider about medicines that might help you deal with cravings and make quitting easier for you. Relationships Social situations can be difficult when you are quitting smoking. To manage this, you can: Avoid parties and other social situations where people might be smoking. Avoid alcohol. Leave right away if you have the urge to smoke. Explain to your family and friends that you are quitting smoking. Ask for support and let them know you might be a bit grumpy. Plan activities where smoking is not an option. General instructions Be aware that many people gain weight after they quit smoking. However, not everyone does. To keep from gaining weight, have a plan in place before you quit and stick to the plan after you quit.  Your plan should include: Having healthy snacks. When you have a craving, it may help to: Eat popcorn, carrots, celery, or other cut vegetables. Chew sugar-free gum. Changing how you eat. Eat small portion sizes at meals. Eat 4-6 small meals throughout the day instead of 1-2 large meals a day. Be mindful when you eat. Do not watch television or do other things that might distract you as you eat. Exercising regularly. Make time to exercise each day. If you do not have time for a long workout, do short bouts of exercise for 5-10 minutes several times a day. Do some form of strengthening exercise, such as weight lifting. Do some exercise that gets your heart beating and causes you to breathe deeply, such as walking fast, running, swimming, or biking. This is very important. Drinking plenty of water or other low-calorie or no-calorie drinks. Drink 6-8 glasses of water daily.  How to recognize withdrawal symptoms Your body and mind may experience discomfort as you try to get used to not having nicotine in your system. These effects are called withdrawal symptoms. They may include: Feeling hungrier than normal. Having trouble concentrating. Feeling irritable or restless. Having trouble sleeping. Feeling depressed. Craving a cigarette. To manage withdrawal symptoms: Avoid places, people, and activities that trigger your cravings. Remember why you want to quit. Get plenty of sleep. Avoid coffee and other caffeinated drinks. These may worsen some of your symptoms. These symptoms may surprise you. But be assured that they are normal to have when quitting smoking. How to manage cravings Come up with  a plan for how to deal with your cravings. The plan should include the following: A definition of the specific situation you want to deal with. An alternative action you will take. A clear idea for how this action will help. The name of someone who might help you with this. Cravings usually last  for 5-10 minutes. Consider taking the following actions to help you with your plan to deal with cravings: Keep your mouth busy. Chew sugar-free gum. Suck on hard candies or a straw. Brush your teeth. Keep your hands and body busy. Change to a different activity right away. Squeeze or play with a ball. Do an activity or a hobby, such as making bead jewelry, practicing needlepoint, or working with wood. Mix up your normal routine. Take a short exercise break. Go for a quick walk or run up and down stairs. Focus on doing something kind or helpful for someone else. Call a friend or family member to talk during a craving. Join a support group. Contact a quitline. Where to find support To get help or find a support group: Call the National Cancer Institute's Smoking Quitline: 1-800-QUIT NOW 6360622012) Visit the website of the Substance Abuse and Mental Health Services Administration: SkateOasis.com.pt Text QUIT to SmokefreeTXT: 128786 Where to find more information Visit these websites to find more information on quitting smoking: National Cancer Institute: www.smokefree.gov American Lung Association: www.lung.org American Cancer Society: www.cancer.org Centers for Disease Control and Prevention: FootballExhibition.com.br American Heart Association: www.heart.org Contact a health care provider if: You want to change your plan for quitting. The medicines you are taking are not helping. Your eating feels out of control or you cannot sleep. Get help right away if: You feel depressed or become very anxious. Summary Quitting smoking is a physical and mental challenge. You will face cravings, withdrawal symptoms, and temptation to smoke again. Preparation can help you as you go through these challenges. Try different techniques to manage stress, handle social situations, and prevent weight gain. You can deal with cravings by keeping your mouth busy (such as by chewing gum), keeping your hands and body busy,  calling family or friends, or contacting a quitline for people who want to quit smoking. You can deal with withdrawal symptoms by avoiding places where people smoke, getting plenty of rest, and avoiding drinks with caffeine. This information is not intended to replace advice given to you by your health care provider. Make sure you discuss any questions you have with your health care provider. Document Revised: 05/14/2021 Document Reviewed: 06/25/2019 Elsevier Patient Education  2022 [employer].

## 2021-10-05 ENCOUNTER — Encounter: Payer: Self-pay | Admitting: Family Medicine

## 2021-10-05 ENCOUNTER — Ambulatory Visit (INDEPENDENT_AMBULATORY_CARE_PROVIDER_SITE_OTHER): Payer: BC Managed Care – PPO | Admitting: Family Medicine

## 2021-10-05 ENCOUNTER — Other Ambulatory Visit: Payer: Self-pay

## 2021-10-05 VITALS — BP 131/89 | HR 85 | Temp 98.1°F | Resp 16 | Ht 69.0 in | Wt 231.4 lb

## 2021-10-05 DIAGNOSIS — Z0000 Encounter for general adult medical examination without abnormal findings: Secondary | ICD-10-CM

## 2021-10-05 DIAGNOSIS — Z114 Encounter for screening for human immunodeficiency virus [HIV]: Secondary | ICD-10-CM

## 2021-10-05 DIAGNOSIS — Z1159 Encounter for screening for other viral diseases: Secondary | ICD-10-CM

## 2021-10-06 ENCOUNTER — Other Ambulatory Visit: Payer: Self-pay | Admitting: Family Medicine

## 2021-10-06 LAB — CBC WITH DIFFERENTIAL/PLATELET
Basophils Absolute: 0 10*3/uL (ref 0.0–0.2)
Basos: 1 %
EOS (ABSOLUTE): 0.2 10*3/uL (ref 0.0–0.4)
Eos: 3 %
Hematocrit: 49.2 % (ref 37.5–51.0)
Hemoglobin: 17.1 g/dL (ref 13.0–17.7)
Immature Grans (Abs): 0 10*3/uL (ref 0.0–0.1)
Immature Granulocytes: 0 %
Lymphocytes Absolute: 1.8 10*3/uL (ref 0.7–3.1)
Lymphs: 26 %
MCH: 31 pg (ref 26.6–33.0)
MCHC: 34.8 g/dL (ref 31.5–35.7)
MCV: 89 fL (ref 79–97)
Monocytes Absolute: 0.5 10*3/uL (ref 0.1–0.9)
Monocytes: 7 %
Neutrophils Absolute: 4.3 10*3/uL (ref 1.4–7.0)
Neutrophils: 63 %
Platelets: 206 10*3/uL (ref 150–450)
RBC: 5.52 x10E6/uL (ref 4.14–5.80)
RDW: 13.1 % (ref 11.6–15.4)
WBC: 6.8 10*3/uL (ref 3.4–10.8)

## 2021-10-06 LAB — CMP14+EGFR
ALT: 17 IU/L (ref 0–44)
AST: 19 IU/L (ref 0–40)
Albumin/Globulin Ratio: 1.9 (ref 1.2–2.2)
Albumin: 5.1 g/dL (ref 4.1–5.2)
Alkaline Phosphatase: 58 IU/L (ref 44–121)
BUN/Creatinine Ratio: 8 — ABNORMAL LOW (ref 9–20)
BUN: 7 mg/dL (ref 6–20)
Bilirubin Total: 0.5 mg/dL (ref 0.0–1.2)
CO2: 23 mmol/L (ref 20–29)
Calcium: 9.8 mg/dL (ref 8.7–10.2)
Chloride: 100 mmol/L (ref 96–106)
Creatinine, Ser: 0.85 mg/dL (ref 0.76–1.27)
Globulin, Total: 2.7 g/dL (ref 1.5–4.5)
Glucose: 89 mg/dL (ref 70–99)
Potassium: 4 mmol/L (ref 3.5–5.2)
Sodium: 139 mmol/L (ref 134–144)
Total Protein: 7.8 g/dL (ref 6.0–8.5)
eGFR: 120 mL/min/{1.73_m2} (ref >59)

## 2021-10-06 LAB — LIPID PANEL
Chol/HDL Ratio: 6.3 ratio — ABNORMAL HIGH (ref 0.0–5.0)
Cholesterol, Total: 201 mg/dL — ABNORMAL HIGH (ref 100–199)
HDL: 32 mg/dL — ABNORMAL LOW (ref >39)
LDL Chol Calc (NIH): 126 mg/dL — ABNORMAL HIGH (ref 0–99)
Triglycerides: 242 mg/dL — ABNORMAL HIGH (ref 0–149)
VLDL Cholesterol Cal: 43 mg/dL — ABNORMAL HIGH (ref 5–40)

## 2021-10-06 LAB — HEPATITIS C ANTIBODY: Hep C Virus Ab: 0.1 s/co ratio (ref 0.0–0.9)

## 2021-10-06 LAB — HIV ANTIBODY (ROUTINE TESTING W REFLEX): HIV Screen 4th Generation wRfx: NONREACTIVE

## 2021-10-06 MED ORDER — ATORVASTATIN CALCIUM 20 MG PO TABS: 20.0000 mg | ORAL_TABLET | Freq: Every day | ORAL | 1 refills | Status: AC

## 2021-10-06 NOTE — Progress Notes (Signed)
Established Patient Office Visit  Subjective:  Patient ID: Jeffrey Preston, male    DOB: 1991-05-25  Age: 31 y.o. MRN: [id number]  CC:  Chief Complaint  Patient presents with   Follow-up    Chest xray    HPI Jeffrey Preston presents for annual physical exam. Patient denies acute complaints or concerns.   History reviewed. No pertinent past medical history.  History reviewed. No pertinent surgical history.  Family History  Family history unknown: Yes    Social History   Socioeconomic History   Marital status: Married    Spouse name: Not on file   Number of children: Not on file   Years of education: Not on file   Highest education level: Not on file  Occupational History   Not on file  Tobacco Use   Smoking status: Every Day    Types: Cigarettes   Smokeless tobacco: Never  Vaping Use   Vaping Use: Never used  Substance and Sexual Activity   Alcohol use: Yes    Comment: Occasionally   Drug use: Never   Sexual activity: Yes    Birth control/protection: None    Comment: Married  Other Topics Concern   Not on file  Social History Narrative   Not on file   Social Determinants of Health   Financial Resource Strain: Not on file  Food Insecurity: Not on file  Transportation Needs: Not on file  Physical Activity: Not on file  Stress: Not on file  Social Connections: Not on file  Intimate Partner Violence: Not on file    ROS Review of Systems  All other systems reviewed and are negative.  Objective:   Today's Vitals: BP 131/89    Pulse 85    Temp 98.1 F (36.7 C) (Oral)    Resp 16    Ht '5\' 9"'  (1.753 m)    Wt 231 lb 6.4 oz (105 kg)    SpO2 93%    BMI 34.17 kg/m   Physical Exam Vitals and nursing note reviewed.  Constitutional:      General: He is not in acute distress.    Appearance: He is obese.  HENT:     Head: Normocephalic and atraumatic.     Right Ear: Tympanic membrane, ear canal and external ear normal.     Left Ear: Tympanic membrane, ear canal  and external ear normal.     Nose: Nose normal.     Mouth/Throat:     Mouth: Mucous membranes are moist.     Pharynx: Oropharynx is clear.  Eyes:     Conjunctiva/sclera: Conjunctivae normal.     Pupils: Pupils are equal, round, and reactive to light.  Neck:     Thyroid: No thyromegaly.  Cardiovascular:     Rate and Rhythm: Normal rate and regular rhythm.     Heart sounds: Normal heart sounds. No murmur heard. Pulmonary:     Effort: Pulmonary effort is normal.     Breath sounds: Normal breath sounds.  Abdominal:     General: There is no distension.     Palpations: Abdomen is soft. There is no mass.     Tenderness: There is no abdominal tenderness.     Hernia: There is no hernia in the left inguinal area or right inguinal area.  Genitourinary:    Penis: Normal and circumcised.      Testes: Normal.  Musculoskeletal:        General: Normal range of motion.     Cervical back:  Normal range of motion and neck supple.     Right lower leg: No edema.     Left lower leg: No edema.  Skin:    General: Skin is warm and dry.  Neurological:     General: No focal deficit present.     Mental Status: He is alert and oriented to person, place, and time. Mental status is at baseline.  Psychiatric:        Mood and Affect: Mood normal.        Behavior: Behavior normal.    Assessment & Plan:   1. Visit for well man health check Unremarkable exam. Routine labs ordered - CMP14+EGFR - CBC with Differential - Lipid Panel  2. Screening for HIV (human immunodeficiency virus)  - HIV antibody (with reflex)  3. Need for hepatitis C screening test  - Hepatitis C Antibody    Outpatient Encounter Medications as of 10/05/2021  Medication Sig   cephALEXin (KEFLEX) 500 MG capsule Take 500 mg by mouth 3 (three) times daily. (Patient not taking: Reported on 10/05/2021)   cyclobenzaprine (FLEXERIL) 10 MG tablet Take by mouth. (Patient not taking: Reported on 10/05/2021)   ibuprofen (ADVIL) 800 MG  tablet Take 1 tablet (800 mg total) by mouth 3 (three) times daily as needed for mild pain or moderate pain. (Patient not taking: Reported on 10/05/2021)   neomycin-polymyxin-hydrocortisone (CORTISPORIN) 3.5-10000-1 OTIC suspension Apply 1-2 drops to right great toe daily after soaking and cover with bandaid (Patient not taking: Reported on 10/05/2021)   ondansetron (ZOFRAN ODT) 4 MG disintegrating tablet Take 1 tablet (4 mg total) by mouth every 8 (eight) hours as needed for nausea or vomiting. (Patient not taking: Reported on 10/05/2021)   No facility-administered encounter medications on file as of 10/05/2021.    Follow-up: Return in about 6 months (around 04/04/2022) for follow up.   Becky Sax, MD

## 2021-10-21 ENCOUNTER — Ambulatory Visit: Payer: BC Managed Care – PPO | Admitting: Family Medicine

## 2021-12-16 ENCOUNTER — Encounter: Payer: Self-pay | Admitting: Emergency Medicine

## 2021-12-16 ENCOUNTER — Other Ambulatory Visit: Payer: Self-pay

## 2021-12-16 ENCOUNTER — Ambulatory Visit
Admission: EM | Admit: 2021-12-16 | Discharge: 2021-12-16 | Disposition: A | Discharge status: Home / Self Care | Payer: BC Managed Care – PPO | Attending: Internal Medicine | Admitting: Internal Medicine

## 2021-12-16 DIAGNOSIS — Z03818 Encounter for observation for suspected exposure to other biological agents ruled out: Secondary | ICD-10-CM | POA: Diagnosis not present

## 2021-12-16 DIAGNOSIS — Z20822 Contact with and (suspected) exposure to covid-19: Secondary | ICD-10-CM | POA: Diagnosis not present

## 2021-12-16 DIAGNOSIS — J029 Acute pharyngitis, unspecified: Secondary | ICD-10-CM | POA: Insufficient documentation

## 2021-12-16 DIAGNOSIS — J069 Acute upper respiratory infection, unspecified: Secondary | ICD-10-CM | POA: Diagnosis not present

## 2021-12-16 LAB — POCT RAPID STREP A (OFFICE): Rapid Strep A Screen: NEGATIVE

## 2021-12-16 NOTE — ED Triage Notes (Signed)
Patient c/o itchy throat, cough, fatigue x 2 days.  Patient's boss tested positive for COVID last week.  Patient has taken Nyquil. ?

## 2021-12-16 NOTE — Discharge Instructions (Signed)
Rapid strep was negative.  COVID test is pending.  It appears that you have a viral upper respiratory infection that should self resolve in the next few days with symptomatic treatment. ?

## 2021-12-16 NOTE — ED Provider Notes (Signed)
?[facility] ? ? ? ?CSN: [id number] ?Arrival date & time: 12/16/21  0802 ? ? ?  ? ?History   ?Chief Complaint ?Chief Complaint  ?Patient presents with  ? Cough  ? ? ?HPI ?Jeffrey Preston is a 31 y.o. male.  ? ?Patient presents with sore throat, cough, nasal congestion, fatigue that has been present for approximately 2 days.  He reports that his employer tested positive for COVID-19 recently.  Patient denies any documented fevers but reports that he had a tactile fever at home.  Denies chest pain, shortness of breath, ear pain, nausea, vomiting, diarrhea, abdominal pain.  He has taken NyQuil for symptoms with minimal improvement.  Patient is attributing symptoms to allergies. ? ? ?Cough ? ?History reviewed. No pertinent past medical history. ? ?Patient Active Problem List  ? Diagnosis Date Noted  ? Abnormal chest x-ray 08/30/2021  ? Ready to quit smoking 08/30/2021  ? ? ?History reviewed. No pertinent surgical history. ? ? ? ? ?Home Medications   ? ?Prior to Admission medications   ?Medication Sig Start Date End Date Taking? Authorizing Provider  ?atorvastatin (LIPITOR) 20 MG tablet Take 1 tablet (20 mg total) by mouth daily. 10/06/21   Georganna Skeans, MD  ?cephALEXin (KEFLEX) 500 MG capsule Take 500 mg by mouth 3 (three) times daily. ?Patient not taking: Reported on 10/05/2021 11/27/20   [provider]  ?cyclobenzaprine (FLEXERIL) 10 MG tablet Take by mouth. ?Patient not taking: Reported on 10/05/2021    [provider]  ?ibuprofen (ADVIL) 800 MG tablet Take 1 tablet (800 mg total) by mouth 3 (three) times daily as needed for mild pain or moderate pain. ?Patient not taking: Reported on 10/05/2021 08/25/21   Gustavus Bryant, FNP  ?neomycin-polymyxin-hydrocortisone (CORTISPORIN) 3.5-10000-1 OTIC suspension Apply 1-2 drops to right great toe daily after soaking and cover with bandaid ?Patient not taking: Reported on 10/05/2021 12/03/20   Edwin Cap, DPM  ?ondansetron (ZOFRAN ODT) 4 MG  disintegrating tablet Take 1 tablet (4 mg total) by mouth every 8 (eight) hours as needed for nausea or vomiting. ?Patient not taking: Reported on 10/05/2021 01/22/21   Merrilee Jansky, MD  ? ? ?Family History ?Family History  ?Family history unknown: Yes  ? ? ?Social History ?Social History  ? ?Tobacco Use  ? Smoking status: Every Day  ?  Types: Cigarettes  ? Smokeless tobacco: Never  ?Vaping Use  ? Vaping Use: Never used  ?Substance Use Topics  ? Alcohol use: Yes  ?  Comment: Occasionally  ? Drug use: Never  ? ? ? ?Allergies   ?Patient has no known allergies. ? ? ?Review of Systems ?Review of Systems ?Per HPI ? ?Physical Exam ?Triage Vital Signs ?ED Triage Vitals  ?Enc Vitals Group  ?   BP 12/16/21 0817 (!) 142/92  ?   Pulse Rate 12/16/21 0817 91  ?   Resp 12/16/21 0817 18  ?   Temp 12/16/21 0817 98 ?F (36.7 ?C)  ?   Temp Source 12/16/21 0817 Oral  ?   SpO2 12/16/21 0817 98 %  ?   Weight 12/16/21 0818 231 lb (104.8 kg)  ?   Height 12/16/21 0818 5\' 9"  (1.753 m)  ?   Head Circumference --   ?   Peak Flow --   ?   Pain Score 12/16/21 0818 6  ?   Pain Loc --   ?   Pain Edu? --   ?   Excl. in GC? --   ? ?  No data found. ? ?Updated Vital Signs ?BP (!) 142/92 (BP Location: Left Arm)   Pulse 91   Temp 98 ?F (36.7 ?C) (Oral)   Resp 18   Ht 5\' 9"  (1.753 m)   Wt 231 lb (104.8 kg)   SpO2 98%   BMI 34.11 kg/m?  ? ?Visual Acuity ?Right Eye Distance:   ?Left Eye Distance:   ?Bilateral Distance:   ? ?Right Eye Near:   ?Left Eye Near:    ?Bilateral Near:    ? ?Physical Exam ?Constitutional:   ?   General: He is not in acute distress. ?   Appearance: Normal appearance. He is not toxic-appearing or diaphoretic.  ?HENT:  ?   Head: Normocephalic and atraumatic.  ?   Right Ear: Tympanic membrane and ear canal normal.  ?   Left Ear: Tympanic membrane and ear canal normal.  ?   Nose: Congestion present.  ?   Mouth/Throat:  ?   Mouth: Mucous membranes are moist.  ?   Pharynx: Posterior oropharyngeal erythema present.  ?Eyes:  ?    Extraocular Movements: Extraocular movements intact.  ?   Conjunctiva/sclera: Conjunctivae normal.  ?   Pupils: Pupils are equal, round, and reactive to light.  ?Cardiovascular:  ?   Rate and Rhythm: Normal rate and regular rhythm.  ?   Pulses: Normal pulses.  ?   Heart sounds: Normal heart sounds.  ?Pulmonary:  ?   Effort: Pulmonary effort is normal. No respiratory distress.  ?   Breath sounds: Normal breath sounds. No stridor. No wheezing, rhonchi or rales.  ?Abdominal:  ?   General: Abdomen is flat. Bowel sounds are normal.  ?   Palpations: Abdomen is soft.  ?Musculoskeletal:     ?   General: Normal range of motion.  ?   Cervical back: Normal range of motion.  ?Skin: ?   General: Skin is warm and dry.  ?Neurological:  ?   General: No focal deficit present.  ?   Mental Status: He is alert and oriented to person, place, and time. Mental status is at baseline.  ?Psychiatric:     ?   Mood and Affect: Mood normal.     ?   Behavior: Behavior normal.  ? ? ? ?UC Treatments / Results  ?Labs ?(all labs ordered are listed, but only abnormal results are displayed) ?Labs Reviewed  ?NOVEL CORONAVIRUS, NAA  ?CULTURE, GROUP A STREP [facility])  ?POCT RAPID STREP A (OFFICE)  ? ? ?EKG ? ? ?Radiology ?No results found. ? ?Procedures ?Procedures (including critical care time) ? ?Medications Ordered in UC ?Medications - No data to display ? ?Initial Impression / Assessment and Plan / UC Course  ?I have reviewed the triage vital signs and the nursing notes. ? ?Pertinent labs & imaging results that were available during my care of the patient were reviewed by me and considered in my medical decision making (see chart for details). ? ?  ? ?Patient presents with symptoms likely from a viral upper respiratory infection. Differential includes bacterial pneumonia, sinusitis, allergic rhinitis, Covid 19. Do not suspect underlying cardiopulmonary process. Symptoms seem unlikely related to ACS, CHF or COPD exacerbations, pneumonia, pneumothorax.  Patient is nontoxic appearing and not in need of emergent medical intervention.  Rapid strep was negative.  Throat culture and COVID test pending.  Highly suspicious of COVID given patient's close exposure. ? ?Recommended symptom control with over the counter medications: Daily oral anti-histamine, Oral decongestant or IN corticosteroid, saline irrigations, cepacol  lozenges, Robitussin, Delsym, honey tea. ? ?Return if symptoms fail to improve in 1-2 weeks or you develop shortness of breath, chest pain, severe headache. Patient states understanding and is agreeable. ? ?Discharged with PCP followup.  ?Final Clinical Impressions(s) / UC Diagnoses  ? ?Final diagnoses:  ?Viral upper respiratory tract infection with cough  ?Sore throat  ? ? ? ?Discharge Instructions   ? ?  ?Rapid strep was negative.  COVID test is pending.  It appears that you have a viral upper respiratory infection that should self resolve in the next few days with symptomatic treatment. ? ? ? ? ?ED Prescriptions   ?None ?  ? ?PDMP not reviewed this encounter. ?  ?Gustavus BryantMound, Aniruddh Ciavarella E, [place]NP ?12/16/21 0848 ? ?

## 2021-12-17 LAB — NOVEL CORONAVIRUS, NAA: SARS-CoV-2, NAA: NOT DETECTED

## 2021-12-18 LAB — CULTURE, GROUP A STREP (THRC)

## 2023-03-13 IMAGING — DX DG RIBS W/ CHEST 3+V*R*
5 series · 5 of 5 positions shown · non-contrast
Comparison: None.

CLINICAL DATA: Right-sided rib pain

EXAM:
RIGHT RIBS AND CHEST - 3+ VIEW

[chest pa]
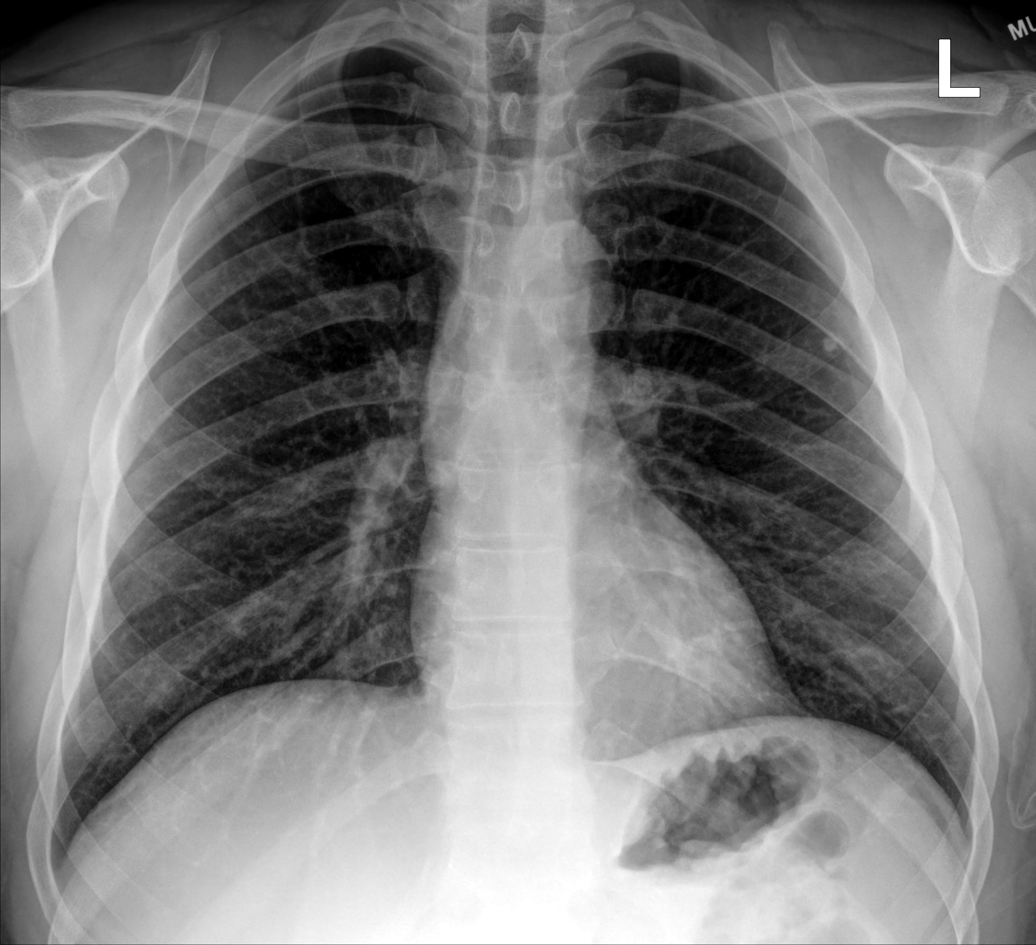

[hemithorax (ribs) pa (1 of 2)]
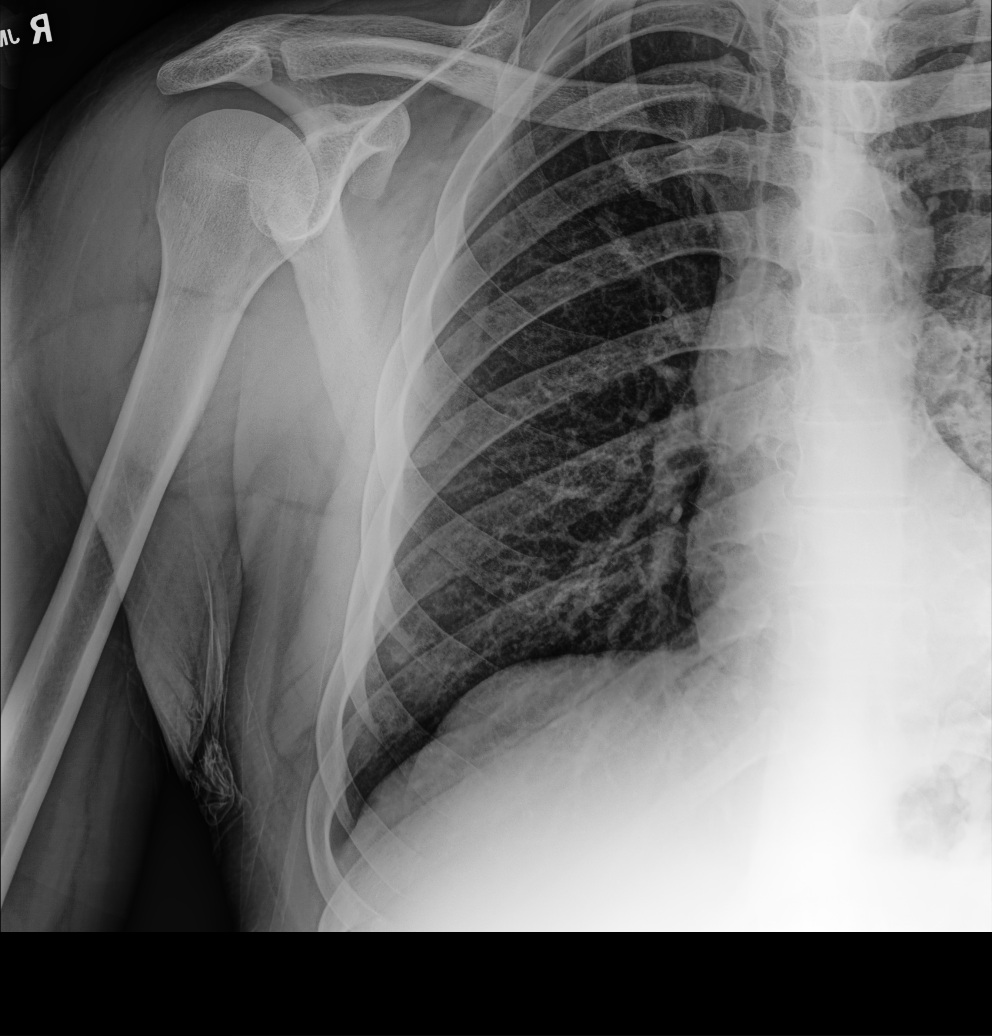

[hemithorax (ribs) pa (2 of 2)]
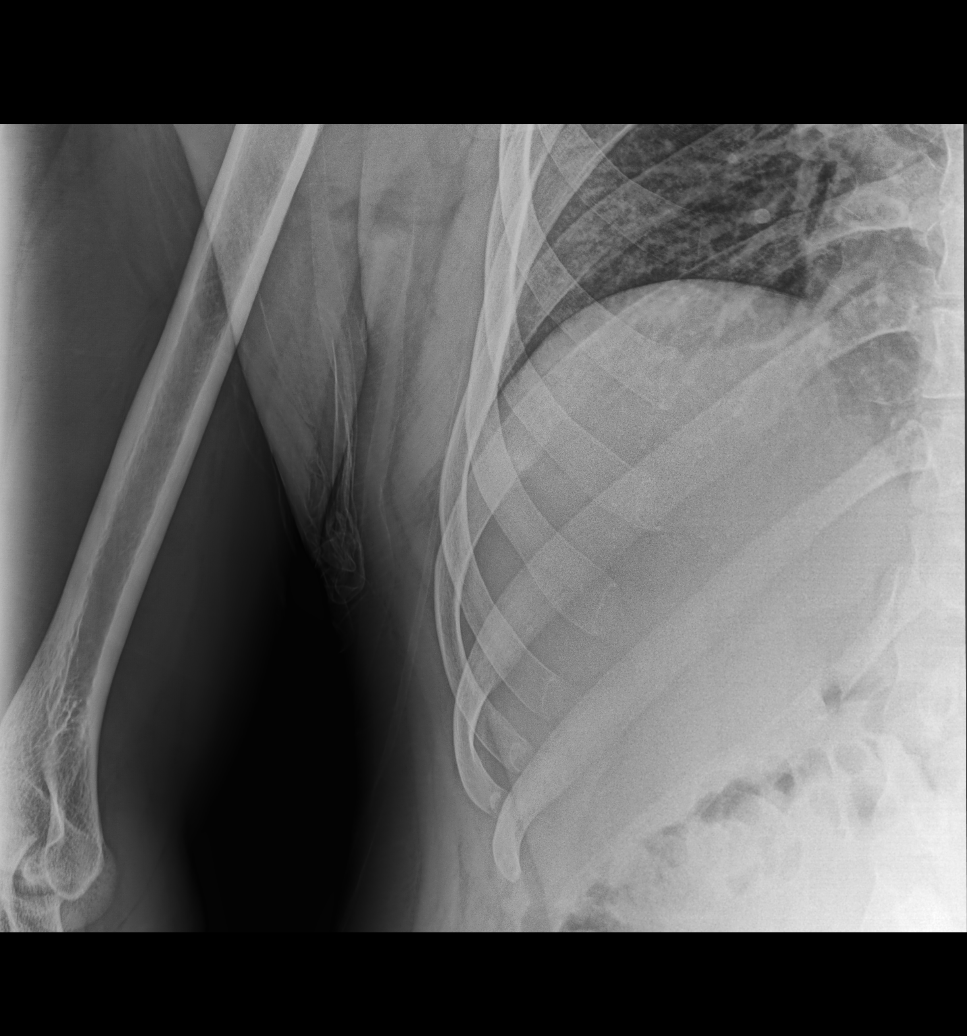

[hemithorax (ribs) pa obl (1 of 2)]
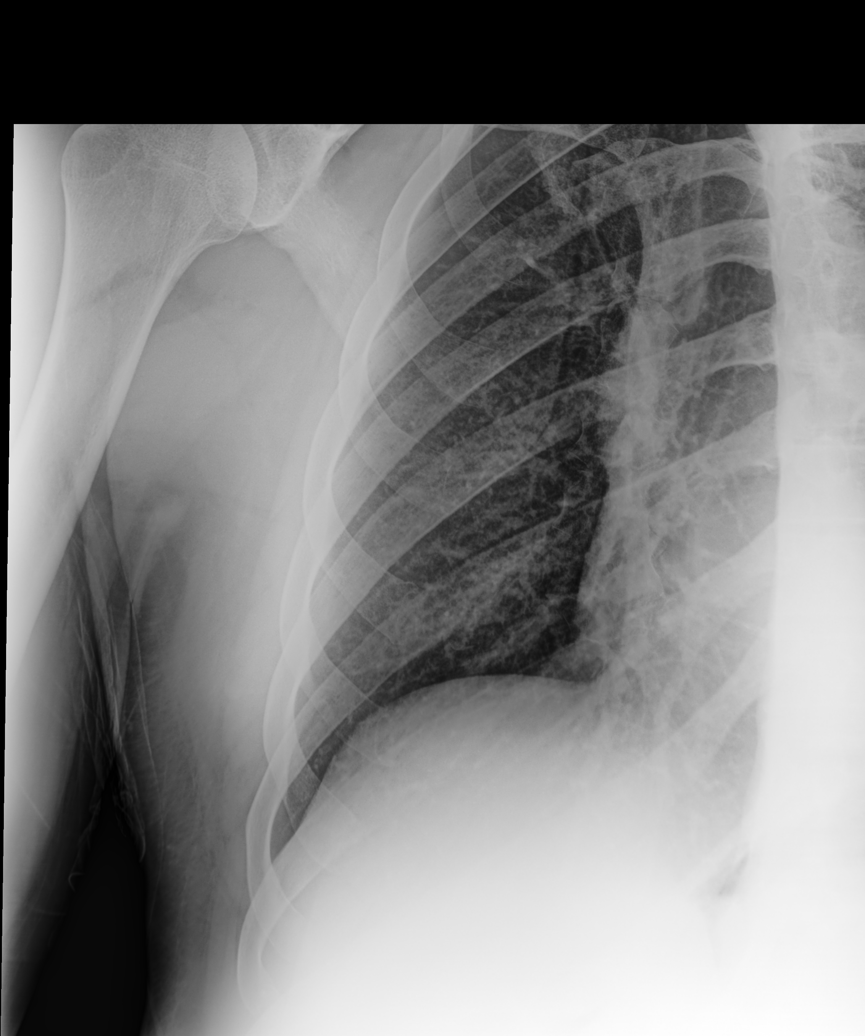

[hemithorax (ribs) pa obl (2 of 2)]
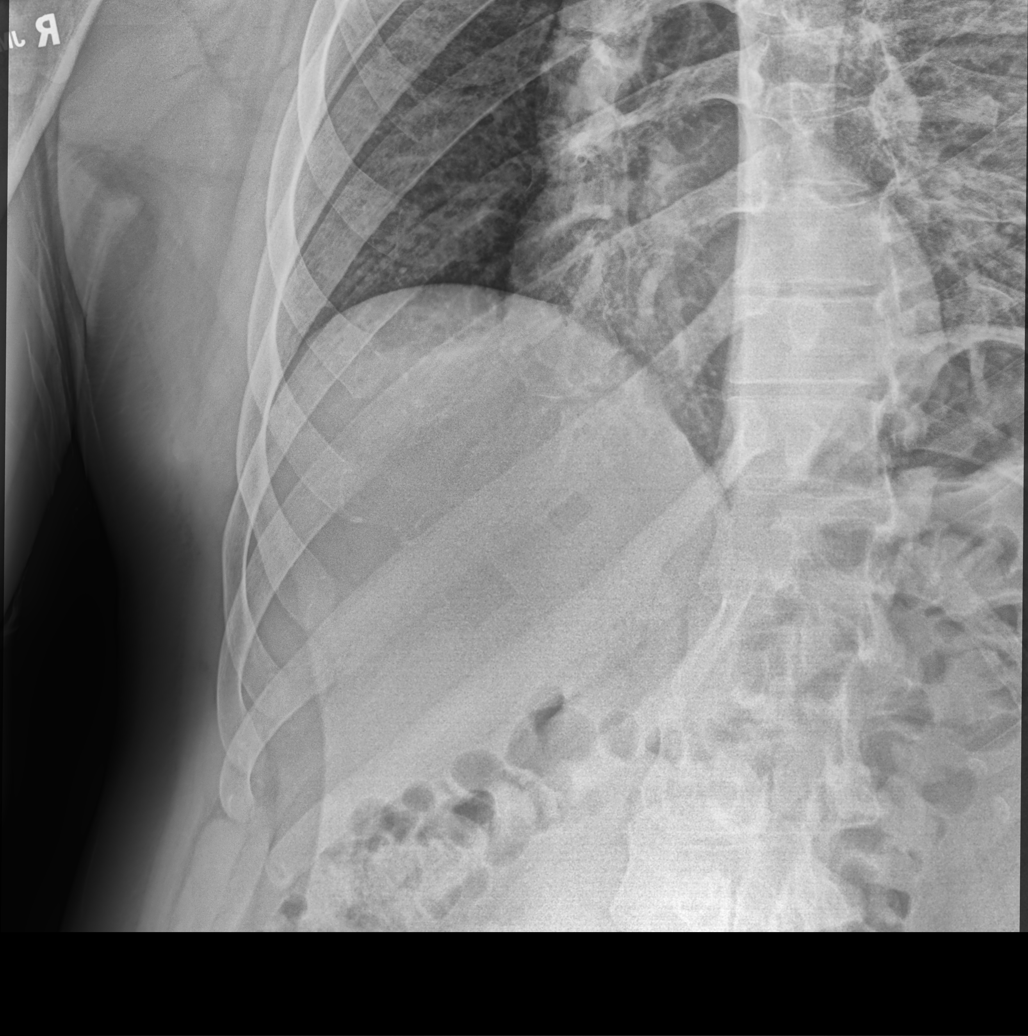

[5 of 5 positions shown; findings below may reference images not displayed]

FINDINGS: No fracture or other bone lesions are seen involving the ribs. There
is no evidence of pneumothorax or pleural effusion. Both lungs are
clear. Heart size and mediastinal contours are within normal limits.
Probable calcified granuloma in the left upper lung.
IMPRESSION: Negative.

## 2023-10-02 DIAGNOSIS — J019 Acute sinusitis, unspecified: Secondary | ICD-10-CM | POA: Diagnosis not present

## 2023-10-02 DIAGNOSIS — Z20822 Contact with and (suspected) exposure to covid-19: Secondary | ICD-10-CM | POA: Diagnosis not present

## 2023-10-02 DIAGNOSIS — J209 Acute bronchitis, unspecified: Secondary | ICD-10-CM | POA: Diagnosis not present

## 2024-01-18 DIAGNOSIS — M7612 Psoas tendinitis, left hip: Secondary | ICD-10-CM | POA: Diagnosis not present

## 2024-01-18 DIAGNOSIS — S76212A Strain of adductor muscle, fascia and tendon of left thigh, initial encounter: Secondary | ICD-10-CM | POA: Diagnosis not present
